# Patient Record
Sex: Male | Born: 2005 | Hispanic: No | Smoking: Never smoker
Health system: Southern US, Community
[De-identification: ages and names within clinical notes are randomized; demographics above are authoritative.]

## PROBLEM LIST (undated history)

## (undated) DIAGNOSIS — S83249A Other tear of medial meniscus, current injury, unspecified knee, initial encounter: Secondary | ICD-10-CM

## (undated) DIAGNOSIS — F4310 Post-traumatic stress disorder, unspecified: Secondary | ICD-10-CM

---
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
## 2023-07-22 ENCOUNTER — Encounter (HOSPITAL_BASED_OUTPATIENT_CLINIC_OR_DEPARTMENT_OTHER): Payer: Self-pay | Admitting: Orthopedic Surgery

## 2023-07-22 ENCOUNTER — Other Ambulatory Visit: Payer: Self-pay

## 2023-07-28 NOTE — Discharge Instructions (Signed)
ACL Reconstruction Post-Operative Instructions  Next tylenol dose after 2pm Diet: Start with some clear liquids, soups, etc, and advance to your regular diet as tolerated.  Dressing: You may remove your dressing 3-5 days after surgery and shower. There are steri-strips (white strips) over the incisions. Your stitches are absorbable. Leave the steri-strips in place when changing your dressings, they will peel off with time, usually 2-3 weeks. Keep your wounds covered with band-aids/gauze until your first post-op appointment.  Activity: Increase activity slowly as tolerated, butRegional Anesthesia Blocks  1. You may not be able to move or feel the "blocked" extremity after a regional anesthetic block. This may last may last from 3-48 hours after placement, but it will go away. The length of time depends on the medication injected and your individual response to the medication. As the nerves start to wake up, you may experience tingling as the movement and feeling returns to your extremity. If the numbness and inability to move your extremity has not gone away after 48 hours, please call your surgeon.   2. The extremity that is blocked will need to be protected until the numbness is gone and the strength has returned. Because you cannot feel it, you will need to take extra care to avoid injury. Because it may be weak, you may have difficulty moving it or using it. You may not know what position it is in without looking at it while the block is in effect.  3. For blocks in the legs and feet, returning to weight bearing and walking needs to be done carefully. You will need to wait until the numbness is entirely gone and the strength has returned. You should be able to move your leg and foot normally before you try and bear weight or walk. You will need someone to be with you when you first try to ensure you do not fall and possibly risk injury.  4. Bruising and tenderness at the needle site are common side  effects and will resolve in a few days.  5. Persistent numbness or new problems with movement should be communicated to the surgeon or the [facility] [phone] [facility] [phone]).Regional Anesthesia Blocks  1. You may not be able to move or feel the "blocked" extremity after a regional anesthetic block. This may last may last from 3-48 hours after placement, but it will go away. The length of time depends on the medication injected and your individual response to the medication. As the nerves start to wake up, you may experience tingling as the movement and feeling returns to your extremity. If the numbness and inability to move your extremity has not gone away after 48 hours, please call your surgeon.   2. The extremity that is blocked will need to be protected until the numbness is gone and the strength has returned. Because you cannot feel it, you will need to take extra care to avoid injury. Because it may be weak, you may have difficulty moving it or using it. You may not know what position it is in without looking at it while the block is in effect.  3. For blocks in the legs and feet, returning to weight bearing and walking needs to be done carefully. You will need to wait until the numbness is entirely gone and the strength has returned. You should be able to move your leg and foot normally before you try and bear weight or walk. You will need someone to be  with you when you first try to ensure you do not fall and possibly risk injury.  4. Bruising and tenderness at the needle site are common side effects and will resolve in a few days.  5. Persistent numbness or new problems with movement should be communicated to the surgeon or the [facility] [phone] [facility] [phone]).follow the weight bearing instructions below. You cannot drive while taking narcotic medications. You may bend and straighten the leg as soon as  you feel comfortable.   Weight Bearing: As tolerated  Medications: You will want to take some of your pain medications tonight before going to bed to make sure you have something in your system when the numbing medicine/block wears off. The maximum dose of Tylenol (Acetaminophen) in a day is 3000-4000 mg, and beware that your pain medication may have Tylenol (acetaminophen) in it. As your pain improves, you can begin to taper the amount of narcotic you are using. You may want to avoid using ibuprofen/motrin/NSAIDs for the first 4-6 weeks, as they can slow down tendon and bone healing.  To prevent constipation: you may use a stool softener such as - Colace (over the counter) 100 mg by mouth twice a day Drink plenty of fluids (prune juice may be helpful) and high fiber foods Miralax (over the counter) for constipation as needed.  Itching: If you are itching or having other side effects with your pain medications, try taking only a single pain pill, or even half a pain pill at a time. You can also use Benadryl for itching or sleep.  Precautions: If you experience chest pain or shortness of breath - call 911 immediately for transfer tothe hospital emergency department!! If you develop a fever greater that 101 F, purulent drainage from wound, increased redness or drainagefrom wound, or calf pain -- Call the office at [phone]  Follow- Up Appointment: Please call for an appointment to be seen in 2 weeks [phone] in [place].  After-Hours: We have an Urgent Care available for after-hours emergencies located at the [facility] office at [facility] in [place] open from 5:30p-9p every night, and from 10a-2p on Saturday and Sunday. There is also an on call physician 24-7 for emergencies that can be reached at [phone].
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
## 2023-07-29 ENCOUNTER — Ambulatory Visit (HOSPITAL_BASED_OUTPATIENT_CLINIC_OR_DEPARTMENT_OTHER): Payer: Medicaid Other

## 2023-07-29 ENCOUNTER — Encounter (HOSPITAL_BASED_OUTPATIENT_CLINIC_OR_DEPARTMENT_OTHER): Payer: Self-pay | Admitting: Orthopedic Surgery

## 2023-07-29 ENCOUNTER — Ambulatory Visit (HOSPITAL_BASED_OUTPATIENT_CLINIC_OR_DEPARTMENT_OTHER)
Admission: RE | Admit: 2023-07-29 | Discharge: 2023-07-29 | Disposition: A | Discharge status: Home / Self Care | Payer: Medicaid Other | Attending: Orthopedic Surgery | Admitting: Orthopedic Surgery

## 2023-07-29 ENCOUNTER — Ambulatory Visit (HOSPITAL_BASED_OUTPATIENT_CLINIC_OR_DEPARTMENT_OTHER): Payer: Medicaid Other | Admitting: Certified Registered Nurse Anesthetist

## 2023-07-29 ENCOUNTER — Other Ambulatory Visit: Payer: Self-pay

## 2023-07-29 ENCOUNTER — Encounter (HOSPITAL_BASED_OUTPATIENT_CLINIC_OR_DEPARTMENT_OTHER)
Admission: RE | Disposition: A | Discharge status: Home / Self Care | Payer: Self-pay | Source: Home / Self Care | Attending: Orthopedic Surgery

## 2023-07-29 DIAGNOSIS — S83241A Other tear of medial meniscus, current injury, right knee, initial encounter: Secondary | ICD-10-CM | POA: Diagnosis not present

## 2023-07-29 DIAGNOSIS — S83281A Other tear of lateral meniscus, current injury, right knee, initial encounter: Secondary | ICD-10-CM | POA: Insufficient documentation

## 2023-07-29 DIAGNOSIS — Y9351 Activity, roller skating (inline) and skateboarding: Secondary | ICD-10-CM | POA: Diagnosis not present

## 2023-07-29 DIAGNOSIS — S83511A Sprain of anterior cruciate ligament of right knee, initial encounter: Secondary | ICD-10-CM | POA: Insufficient documentation

## 2023-07-29 DIAGNOSIS — F17210 Nicotine dependence, cigarettes, uncomplicated: Secondary | ICD-10-CM | POA: Diagnosis not present

## 2023-07-29 HISTORY — DX: Other tear of medial meniscus, current injury, unspecified knee, initial encounter: S83.249A

## 2023-07-29 HISTORY — PX: KNEE ARTHROSCOPY WITH LATERAL MENISECTOMY: SHX6193

## 2023-07-29 SURGERY — ARTHROSCOPY, KNEE, WITH LATERAL MENISCECTOMY
Anesthesia: General | Site: Knee | Laterality: Right

## 2023-07-29 MED ORDER — PROPOFOL 10 MG/ML IV BOLUS
INTRAVENOUS | Status: AC
  Filled 2023-07-29: qty 20

## 2023-07-29 MED ORDER — ONDANSETRON HCL 4 MG PO TABS: 4.0000 mg | ORAL_TABLET | Freq: Three times a day (TID) | ORAL | 0 refills | Status: AC | PRN

## 2023-07-29 MED ORDER — POVIDONE-IODINE 10 % EX SWAB: 2.0000 | Freq: Once | CUTANEOUS | Status: DC

## 2023-07-29 MED ORDER — LACTATED RINGERS IV SOLN: INTRAVENOUS | Status: DC | PRN

## 2023-07-29 MED ORDER — ROPIVACAINE HCL 7.5 MG/ML IJ SOLN
INTRAMUSCULAR | Status: DC | PRN
  Administered 2023-07-29: 20 mL via PERINEURAL

## 2023-07-29 MED ORDER — MEPERIDINE HCL 25 MG/ML IJ SOLN: 6.2500 mg | INTRAMUSCULAR | Status: DC | PRN

## 2023-07-29 MED ORDER — OXYCODONE HCL 5 MG PO TABS: 5.0000 mg | ORAL_TABLET | Freq: Four times a day (QID) | ORAL | 0 refills | Status: AC | PRN

## 2023-07-29 MED ORDER — MIDAZOLAM HCL 2 MG/2ML IJ SOLN
2.0000 mg | Freq: Once | INTRAMUSCULAR | Status: AC
  Administered 2023-07-29: 2 mg via INTRAVENOUS

## 2023-07-29 MED ORDER — HYDROMORPHONE HCL 1 MG/ML IJ SOLN
INTRAMUSCULAR | Status: AC
  Filled 2023-07-29: qty 0.5

## 2023-07-29 MED ORDER — DEXAMETHASONE SODIUM PHOSPHATE 10 MG/ML IJ SOLN
INTRAMUSCULAR | Status: DC | PRN
  Administered 2023-07-29: 5 mg via INTRAVENOUS

## 2023-07-29 MED ORDER — MIDAZOLAM HCL 2 MG/2ML IJ SOLN: 0.5000 mg | Freq: Once | INTRAMUSCULAR | Status: DC | PRN

## 2023-07-29 MED ORDER — DEXMEDETOMIDINE HCL IN NACL 80 MCG/20ML IV SOLN
INTRAVENOUS | Status: DC | PRN
Start: 2023-07-29 — End: 2023-07-29
  Administered 2023-07-29: 8 ug via INTRAVENOUS
  Administered 2023-07-29: 12 ug via INTRAVENOUS

## 2023-07-29 MED ORDER — FENTANYL CITRATE (PF) 100 MCG/2ML IJ SOLN
INTRAMUSCULAR | Status: AC
  Filled 2023-07-29: qty 2

## 2023-07-29 MED ORDER — MIDAZOLAM HCL 2 MG/2ML IJ SOLN
INTRAMUSCULAR | Status: AC
  Filled 2023-07-29: qty 2

## 2023-07-29 MED ORDER — ONDANSETRON HCL 4 MG/2ML IJ SOLN
INTRAMUSCULAR | Status: DC | PRN
  Administered 2023-07-29: 4 mg via INTRAVENOUS

## 2023-07-29 MED ORDER — FENTANYL CITRATE (PF) 100 MCG/2ML IJ SOLN
100.0000 ug | Freq: Once | INTRAMUSCULAR | Status: AC
  Administered 2023-07-29: 100 ug via INTRAVENOUS

## 2023-07-29 MED ORDER — LACTATED RINGERS IV SOLN: INTRAVENOUS | Status: DC

## 2023-07-29 MED ORDER — PROMETHAZINE HCL 25 MG/ML IJ SOLN: 6.2500 mg | INTRAMUSCULAR | Status: DC | PRN

## 2023-07-29 MED ORDER — ACETAMINOPHEN 500 MG PO TABS
ORAL_TABLET | ORAL | Status: AC
  Filled 2023-07-29: qty 2

## 2023-07-29 MED ORDER — ACETAMINOPHEN 500 MG PO TABS
1000.0000 mg | ORAL_TABLET | Freq: Once | ORAL | Status: AC
  Administered 2023-07-29: 1000 mg via ORAL

## 2023-07-29 MED ORDER — OXYCODONE HCL 5 MG PO TABS
ORAL_TABLET | ORAL | Status: AC
  Filled 2023-07-29: qty 1

## 2023-07-29 MED ORDER — SODIUM CHLORIDE 0.9 % IR SOLN
Status: DC | PRN
  Administered 2023-07-29 (×5): 3000 mL

## 2023-07-29 MED ORDER — ONDANSETRON HCL 4 MG/2ML IJ SOLN
INTRAMUSCULAR | Status: AC
  Filled 2023-07-29: qty 2

## 2023-07-29 MED ORDER — CEFAZOLIN SODIUM-DEXTROSE 2-3 GM-%(50ML) IV SOLR
INTRAVENOUS | Status: DC | PRN
  Administered 2023-07-29: 2 g via INTRAVENOUS

## 2023-07-29 MED ORDER — HYDROMORPHONE HCL 1 MG/ML IJ SOLN
0.2500 mg | INTRAMUSCULAR | Status: DC | PRN
  Administered 2023-07-29 (×3): 0.5 mg via INTRAVENOUS

## 2023-07-29 MED ORDER — DEXAMETHASONE SODIUM PHOSPHATE 10 MG/ML IJ SOLN
INTRAMUSCULAR | Status: AC
  Filled 2023-07-29: qty 1

## 2023-07-29 MED ORDER — HYDROMORPHONE HCL 1 MG/ML IJ SOLN
INTRAMUSCULAR | Status: DC | PRN
  Administered 2023-07-29: .5 mg via INTRAVENOUS

## 2023-07-29 MED ORDER — FENTANYL CITRATE (PF) 100 MCG/2ML IJ SOLN
INTRAMUSCULAR | Status: DC | PRN
  Administered 2023-07-29: 25 ug via INTRAVENOUS
  Administered 2023-07-29: 50 ug via INTRAVENOUS
  Administered 2023-07-29: 25 ug via INTRAVENOUS
  Administered 2023-07-29 (×2): 50 ug via INTRAVENOUS

## 2023-07-29 MED ORDER — PROPOFOL 10 MG/ML IV BOLUS
INTRAVENOUS | Status: DC | PRN
  Administered 2023-07-29: 300 mg via INTRAVENOUS

## 2023-07-29 MED ORDER — OXYCODONE HCL 5 MG/5ML PO SOLN: 5.0000 mg | Freq: Once | ORAL | Status: AC | PRN

## 2023-07-29 MED ORDER — POVIDONE-IODINE 7.5 % EX SOLN
Freq: Once | CUTANEOUS | Status: DC
  Filled 2023-07-29: qty 118

## 2023-07-29 MED ORDER — ACETAMINOPHEN 500 MG PO TABS: 1000.0000 mg | ORAL_TABLET | Freq: Once | ORAL | Status: AC

## 2023-07-29 MED ORDER — CEFAZOLIN SODIUM-DEXTROSE 2-4 GM/100ML-% IV SOLN: 2.0000 g | INTRAVENOUS | Status: DC

## 2023-07-29 MED ORDER — LIDOCAINE 2% (20 MG/ML) 5 ML SYRINGE
INTRAMUSCULAR | Status: AC
  Filled 2023-07-29: qty 5

## 2023-07-29 MED ORDER — LIDOCAINE HCL (CARDIAC) PF 100 MG/5ML IV SOSY
PREFILLED_SYRINGE | INTRAVENOUS | Status: DC | PRN
  Administered 2023-07-29: 100 mg via INTRAVENOUS

## 2023-07-29 MED ORDER — OXYCODONE HCL 5 MG PO TABS
5.0000 mg | ORAL_TABLET | Freq: Once | ORAL | Status: AC | PRN
  Administered 2023-07-29: 5 mg via ORAL

## 2023-07-29 SURGICAL SUPPLY — 71 items
ANCHOR TIGHTROPE II 20 W/IB (Anchor) IMPLANT
BANDAGE ESMARK 6X9 LF (GAUZE/BANDAGES/DRESSINGS) ×1 IMPLANT
BLADE EXCALIBUR 4.0X13 (MISCELLANEOUS) IMPLANT
BLADE SURG 15 STRL LF DISP TIS (BLADE) ×1 IMPLANT
BLADE SURG 15 STRL SS (BLADE) ×2
BNDG CMPR 6 X 5 YARDS HK CLSR (GAUZE/BANDAGES/DRESSINGS) ×1
BNDG CMPR 9X6 STRL LF SNTH (GAUZE/BANDAGES/DRESSINGS) ×1
BNDG ELASTIC 6INX 5YD STR LF (GAUZE/BANDAGES/DRESSINGS) ×1 IMPLANT
BNDG ESMARK 6X9 LF (GAUZE/BANDAGES/DRESSINGS) ×1
BURR OVAL 8 FLU 4.0X13 (MISCELLANEOUS) ×1 IMPLANT
CLSR STERI-STRIP ANTIMIC 1/2X4 (GAUZE/BANDAGES/DRESSINGS) ×1 IMPLANT
COVER BACK TABLE 60X90IN (DRAPES) ×1 IMPLANT
CUFF TOURN SGL QUICK 34 (TOURNIQUET CUFF) ×1
CUFF TRNQT CYL 34X4.125X (TOURNIQUET CUFF) IMPLANT
CUTTER TENSIONER SUT 2-0 0 FBW (INSTRUMENTS) IMPLANT
DISSECTOR 3.8MM X 13CM (MISCELLANEOUS) ×1 IMPLANT
DISSECTOR 4.0MM X 13CM (MISCELLANEOUS) IMPLANT
DRAPE ARTHROSCOPY W/POUCH 90 (DRAPES) ×1 IMPLANT
DRAPE IMP U-DRAPE 54X76 (DRAPES) ×1 IMPLANT
DRAPE OEC MINIVIEW 54X84 (DRAPES) ×1 IMPLANT
DRAPE U-SHAPE 47X51 STRL (DRAPES) ×1 IMPLANT
DRAPE-T ARTHROSCOPY W/POUCH (DRAPES) ×1 IMPLANT
DRILL FLIPCUTTER III 6-12 (ORTHOPEDIC DISPOSABLE SUPPLIES) ×1 IMPLANT
DURAPREP 26ML APPLICATOR (WOUND CARE) ×1 IMPLANT
ELECT MENISCUS 165MM 90D (ELECTRODE) IMPLANT
ELECT REM PT RETURN 9FT ADLT (ELECTROSURGICAL) ×1
ELECTRODE REM PT RTRN 9FT ADLT (ELECTROSURGICAL) ×1 IMPLANT
EXCALIBUR 3.8MM X 13CM (MISCELLANEOUS) IMPLANT
FIBERSTICK 2 (SUTURE) ×1 IMPLANT
FLIPCUTTER III 6-12 AR-1204FF (ORTHOPEDIC DISPOSABLE SUPPLIES) ×1
GAUZE SPONGE 4X4 12PLY STRL (GAUZE/BANDAGES/DRESSINGS) ×1 IMPLANT
GLOVE BIO SURGEON STRL SZ7 (GLOVE) ×2 IMPLANT
GLOVE BIOGEL PI IND STRL 7.0 (GLOVE) ×2 IMPLANT
GLOVE BIOGEL PI IND STRL 8 (GLOVE) ×2 IMPLANT
GLOVE ORTHO TXT STRL SZ7.5 (GLOVE) ×1 IMPLANT
GOWN STRL REUS W/ TWL LRG LVL3 (GOWN DISPOSABLE) ×3 IMPLANT
GOWN STRL REUS W/ TWL XL LVL3 (GOWN DISPOSABLE) ×2 IMPLANT
GOWN STRL REUS W/TWL LRG LVL3 (GOWN DISPOSABLE) ×1
GOWN STRL REUS W/TWL XL LVL3 (GOWN DISPOSABLE) ×4
IMMOBILIZER KNEE 22 UNIV (SOFTGOODS) ×1 IMPLANT
IMMOBILIZER KNEE 24 THIGH 36 (MISCELLANEOUS) ×1 IMPLANT
IMMOBILIZER KNEE 24 UNIV (MISCELLANEOUS) ×2
IV NS IRRIG 3000ML ARTHROMATIC (IV SOLUTION) ×2 IMPLANT
KIT TRANSTIBIAL (DISPOSABLE) ×1 IMPLANT
MANIFOLD NEPTUNE II (INSTRUMENTS) ×1 IMPLANT
NS IRRIG 1000ML POUR BTL (IV SOLUTION) ×1 IMPLANT
PACK ARTHROSCOPY DSU (CUSTOM PROCEDURE TRAY) ×1 IMPLANT
PACK BASIN DAY SURGERY FS (CUSTOM PROCEDURE TRAY) ×1 IMPLANT
PACK ICE MAXI GEL EZY WRAP (MISCELLANEOUS) IMPLANT
PADDING CAST COTTON 6X4 STRL (CAST SUPPLIES) ×1 IMPLANT
PENCIL SMOKE EVACUATOR (MISCELLANEOUS) IMPLANT
PORT APPOLLO RF 90DEGREE MULTI (SURGICAL WAND) ×1 IMPLANT
SCREW INTERFERENCE FT BC 9X20 (Screw) IMPLANT
SLEEVE SCD COMPRESS KNEE MED (STOCKING) ×1 IMPLANT
SPIKE FLUID TRANSFER (MISCELLANEOUS) IMPLANT
SPONGE T-LAP 4X18 ~~LOC~~+RFID (SPONGE) ×1 IMPLANT
SUCTION TUBE FRAZIER 10FR DISP (SUCTIONS) IMPLANT
SUT MNCRL AB 4-0 PS2 18 (SUTURE) ×1 IMPLANT
SUT VIC AB 0 CT1 27 (SUTURE) ×2
SUT VIC AB 0 CT1 27XBRD ANBCTR (SUTURE) IMPLANT
SUT VIC AB 2-0 SH 27 (SUTURE) ×1
SUT VIC AB 2-0 SH 27XBRD (SUTURE) ×1 IMPLANT
SUT VIC AB 3-0 SH 27 (SUTURE) ×1
SUT VIC AB 3-0 SH 27X BRD (SUTURE) ×1 IMPLANT
SUT VIC AB 4-0 PS2 18 (SUTURE) IMPLANT
SUTURE TAPE 1.3 FIBERLOP 20 ST (SUTURE) ×2 IMPLANT
SUTURETAPE 1.3 FIBERLOOP 20 ST (SUTURE) ×4
TOWEL GREEN STERILE FF (TOWEL DISPOSABLE) ×2 IMPLANT
TUBING ARTHROSCOPY IRRIG 16FT (MISCELLANEOUS) ×1 IMPLANT
WATER STERILE IRR 1000ML POUR (IV SOLUTION) ×1 IMPLANT
WRAP KNEE MAXI GEL POST OP (GAUZE/BANDAGES/DRESSINGS) ×1 IMPLANT

## 2023-07-29 NOTE — Transfer of Care (Signed)
Immediate Anesthesia Transfer of Care Note  Patient: Martin Adams  Procedure(s) Performed: KNEE ARTHROSCOPY WITH LATERAL MENISECTOMY/MEDIAL MENISCECTOMY (Right: Knee) KNEE ARTHROSCOPY WITH ANTERIOR CRUCIATE LIGAMENT (ACL) RECONSTRUCTION WITH HAMSTRING GRAFT (Right: Knee)  Patient Location: PACU  Anesthesia Type:General and Regional  Level of Consciousness: awake, drowsy, and patient cooperative  Airway & Oxygen Therapy: Patient Spontanous Breathing and Patient connected to face mask oxygen  Post-op Assessment: Report given to RN and Post -op Vital signs reviewed and stable  Post vital signs: Reviewed and stable  Last Vitals:  Vitals Value Taken Time  BP 130/74 07/29/23 1300  Temp    Pulse 64 07/29/23 1302  Resp 16 07/29/23 1302  SpO2 100 % 07/29/23 1302  Vitals shown include unfiled device data.  Last Pain:  Vitals:   07/29/23 0758  TempSrc: Temporal  PainSc: 0-No pain         Complications: No notable events documented.

## 2023-07-29 NOTE — Anesthesia Preprocedure Evaluation (Addendum)
Anesthesia Evaluation  Patient identified by MRN, date of birth, ID band Patient awake    Reviewed: Allergy & Precautions, NPO status , Patient's Chart, lab work & pertinent test results  History of Anesthesia Complications Negative for: history of anesthetic complications  Airway Mallampati: I  TM Distance: >3 FB Neck ROM: Full    Dental  (+) Dental Advisory Given, Teeth Intact   Pulmonary Current Smoker and Patient abstained from smoking.   breath sounds clear to auscultation       Cardiovascular negative cardio ROS  Rhythm:Regular Rate:Normal     Neuro/Psych   Anxiety     negative neurological ROS     GI/Hepatic negative GI ROS,,,(+)     substance abuse  marijuana use  Endo/Other  negative endocrine ROS    Renal/GU negative Renal ROS     Musculoskeletal   Abdominal   Peds  Hematology negative hematology ROS (+)   Anesthesia Other Findings   Reproductive/Obstetrics                             Anesthesia Physical Anesthesia Plan  ASA: 2  Anesthesia Plan: General   Post-op Pain Management: Tylenol PO (pre-op)* and Regional block*   Induction: Intravenous  PONV Risk Score and Plan: 1 and Ondansetron and Dexamethasone  Airway Management Planned: LMA  Additional Equipment: None  Intra-op Plan:   Post-operative Plan:   Informed Consent: I have reviewed the patients History and Physical, chart, labs and discussed the procedure including the risks, benefits and alternatives for the proposed anesthesia with the patient or authorized representative who has indicated his/her understanding and acceptance.     Dental advisory given  Plan Discussed with: CRNA and Surgeon  Anesthesia Plan Comments: (Plan routine monitors, GA with adductor canal block for post op analgesia)       Anesthesia Quick Evaluation

## 2023-07-29 NOTE — Anesthesia Postprocedure Evaluation (Signed)
Anesthesia Post Note  Patient: Martin Adams  Procedure(s) Performed: KNEE ARTHROSCOPY WITH LATERAL MENISECTOMY/MEDIAL MENISCECTOMY (Right: Knee) KNEE ARTHROSCOPY WITH ANTERIOR CRUCIATE LIGAMENT (ACL) RECONSTRUCTION WITH HAMSTRING GRAFT (Right: Knee)     Patient location during evaluation: PACU Anesthesia Type: General Level of consciousness: awake and alert, patient cooperative and oriented Pain management: pain level controlled Vital Signs Assessment: post-procedure vital signs reviewed and stable Respiratory status: spontaneous breathing, nonlabored ventilation and respiratory function stable Cardiovascular status: blood pressure returned to baseline and stable Postop Assessment: no apparent nausea or vomiting and adequate PO intake Anesthetic complications: no   No notable events documented.  Last Vitals:  Vitals:   07/29/23 1400 07/29/23 1414  BP: (!) 146/97 (!) 143/74  Pulse: 66 79  Resp: 10 16  Temp:  36.7 C  SpO2: 100% 97%    Last Pain:  Vitals:   07/29/23 1414  TempSrc: Temporal  PainSc: 4                  Shalie Schremp,E. Khyan Oats

## 2023-07-29 NOTE — Progress Notes (Signed)
Assisted Dr. Annye Asa with right, adductor canal, ultrasound guided block. Side rails up, monitors on throughout procedure. See vital signs in flow sheet. Tolerated Procedure well.

## 2023-07-29 NOTE — Anesthesia Procedure Notes (Signed)
Anesthesia Regional Block: Adductor canal block   Pre-Anesthetic Checklist: , timeout performed,  Correct Patient, Correct Site, Correct Laterality,  Correct Procedure, Correct Position, site marked,  Risks and benefits discussed,  Surgical consent,  Pre-op evaluation,  At surgeon's request and post-op pain management  Laterality: Right and Lower  Prep: chloraprep       Needles:  Injection technique: Single-shot  Needle Type: Echogenic Needle     Needle Length: 9cm  Needle Gauge: 21     Additional Needles:   Procedures:,,,, ultrasound used (permanent image in chart),,    Narrative:  Start time: 07/29/2023 10:15 AM End time: 07/29/2023 10:21 AM Injection made incrementally with aspirations every 5 mL.  Performed by: Personally  Anesthesiologist: Jairo Ben, MD  Additional Notes: Pt identified in Holding room.  Monitors applied. Working IV access confirmed. Timeout, Sterile prep R thigh.  #21ga ECHOgenic Arrow block needle into adductor canal with US guidance.  20cc 0.75% Ropivacaine injected incrementally after negative test dose.  Patient asymptomatic, VSS, no heme aspirated, tolerated well.   Sandford Craze, MD

## 2023-07-29 NOTE — Anesthesia Procedure Notes (Signed)
Procedure Name: LMA Insertion Date/Time: 07/29/2023 10:45 AM  Performed by: Karen Kitchens, CRNAPre-anesthesia Checklist: Patient identified, Emergency Drugs available, Suction available and Patient being monitored Patient Re-evaluated:Patient Re-evaluated prior to induction Oxygen Delivery Method: Circle system utilized Preoxygenation: Pre-oxygenation with 100% oxygen Induction Type: IV induction Ventilation: Mask ventilation without difficulty LMA: LMA inserted LMA Size: 5.0 Number of attempts: 1 Airway Equipment and Method: Bite block Placement Confirmation: positive ETCO2, CO2 detector and breath sounds checked- equal and bilateral Tube secured with: Tape Dental Injury: Teeth and Oropharynx as per pre-operative assessment

## 2023-07-29 NOTE — Interval H&P Note (Signed)
History and Physical Interval Note:  07/29/2023 9:39 AM  Mayford Knife  has presented today for surgery, with the diagnosis of RIGHT KNEE MENISCUS TEAR, ACL TEAR.  The various methods of treatment have been discussed with the patient and family. After consideration of risks, benefits and other options for treatment, the patient has consented to  Procedure(s): KNEE ARTHROSCOPY WITH LATERAL MENISECTOMY/MEDIAL MENISCECTOMY (Right) KNEE ARTHROSCOPY WITH ANTERIOR CRUCIATE LIGAMENT (ACL) RECONSTRUCTION WITH HAMSTRING GRAFT (Right) as a surgical intervention.  The patient's history has been reviewed, patient examined, no change in status, stable for surgery.  I have reviewed the patient's chart and labs.  Questions were answered to the patient's satisfaction.     Eulas Post
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
## 2023-07-30 ENCOUNTER — Other Ambulatory Visit: Payer: Self-pay

## 2023-07-30 ENCOUNTER — Encounter (HOSPITAL_BASED_OUTPATIENT_CLINIC_OR_DEPARTMENT_OTHER): Payer: Self-pay | Admitting: Orthopedic Surgery
